# Patient Record
Sex: Female | Born: 1975 | Race: White | Hispanic: No | Marital: Married | Smoking: Never smoker
Health system: Southern US, Community
[De-identification: ages and names within clinical notes are randomized; demographics above are authoritative.]

## PROBLEM LIST (undated history)

## (undated) DIAGNOSIS — K219 Gastro-esophageal reflux disease without esophagitis: Secondary | ICD-10-CM

## (undated) HISTORY — DX: Gastro-esophageal reflux disease without esophagitis: K21.9

---

## 1999-06-14 ENCOUNTER — Other Ambulatory Visit: Admission: RE | Admit: 1999-06-14 | Discharge: 1999-06-14 | Payer: Self-pay | Admitting: Obstetrics and Gynecology

## 2000-06-12 ENCOUNTER — Other Ambulatory Visit: Admission: RE | Admit: 2000-06-12 | Discharge: 2000-06-12 | Payer: Self-pay | Admitting: Obstetrics and Gynecology

## 2001-10-09 ENCOUNTER — Other Ambulatory Visit: Admission: RE | Admit: 2001-10-09 | Discharge: 2001-10-09 | Payer: Self-pay | Admitting: *Deleted

## 2001-12-19 ENCOUNTER — Other Ambulatory Visit: Admission: RE | Admit: 2001-12-19 | Discharge: 2001-12-19 | Payer: Self-pay | Admitting: *Deleted

## 2002-10-09 ENCOUNTER — Other Ambulatory Visit: Admission: RE | Admit: 2002-10-09 | Discharge: 2002-10-09 | Payer: Self-pay | Admitting: *Deleted

## 2003-12-24 ENCOUNTER — Other Ambulatory Visit: Admission: RE | Admit: 2003-12-24 | Discharge: 2003-12-24 | Payer: Self-pay | Admitting: *Deleted

## 2004-05-04 ENCOUNTER — Encounter: Admission: RE | Admit: 2004-05-04 | Discharge: 2004-05-04 | Payer: Self-pay | Admitting: Internal Medicine

## 2005-01-24 ENCOUNTER — Other Ambulatory Visit: Admission: RE | Admit: 2005-01-24 | Discharge: 2005-01-24 | Payer: Self-pay | Admitting: *Deleted

## 2006-01-25 ENCOUNTER — Other Ambulatory Visit: Admission: RE | Admit: 2006-01-25 | Discharge: 2006-01-25 | Payer: Self-pay | Admitting: *Deleted

## 2007-01-29 ENCOUNTER — Other Ambulatory Visit: Admission: RE | Admit: 2007-01-29 | Discharge: 2007-01-29 | Payer: Self-pay | Admitting: *Deleted

## 2008-01-30 ENCOUNTER — Other Ambulatory Visit: Admission: RE | Admit: 2008-01-30 | Discharge: 2008-01-30 | Payer: Self-pay | Admitting: Gynecology

## 2009-03-24 ENCOUNTER — Ambulatory Visit (HOSPITAL_COMMUNITY): Admission: RE | Admit: 2009-03-24 | Discharge: 2009-03-24 | Payer: Self-pay | Admitting: Gynecology

## 2009-08-04 ENCOUNTER — Encounter: Admission: RE | Admit: 2009-08-04 | Discharge: 2009-11-02 | Payer: Self-pay | Admitting: Obstetrics and Gynecology

## 2009-08-07 ENCOUNTER — Ambulatory Visit (HOSPITAL_COMMUNITY): Admission: RE | Admit: 2009-08-07 | Discharge: 2009-08-07 | Payer: Self-pay | Admitting: Obstetrics and Gynecology

## 2009-09-04 ENCOUNTER — Ambulatory Visit (HOSPITAL_COMMUNITY): Admission: RE | Admit: 2009-09-04 | Discharge: 2009-09-04 | Payer: Self-pay | Admitting: Obstetrics and Gynecology

## 2009-09-22 ENCOUNTER — Inpatient Hospital Stay (HOSPITAL_COMMUNITY): Admission: AD | Admit: 2009-09-22 | Discharge: 2009-09-22 | Payer: Self-pay | Admitting: Obstetrics and Gynecology

## 2009-11-17 ENCOUNTER — Inpatient Hospital Stay (HOSPITAL_COMMUNITY): Admission: AD | Admit: 2009-11-17 | Discharge: 2009-11-17 | Payer: Self-pay | Admitting: Obstetrics and Gynecology

## 2009-11-21 ENCOUNTER — Encounter: Payer: Self-pay | Admitting: Emergency Medicine

## 2009-11-21 ENCOUNTER — Inpatient Hospital Stay (HOSPITAL_COMMUNITY): Admission: AD | Admit: 2009-11-21 | Discharge: 2009-11-21 | Payer: Self-pay | Admitting: Obstetrics and Gynecology

## 2009-12-04 ENCOUNTER — Inpatient Hospital Stay (HOSPITAL_COMMUNITY): Admission: AD | Admit: 2009-12-04 | Discharge: 2009-12-04 | Payer: Self-pay | Admitting: Obstetrics and Gynecology

## 2009-12-22 ENCOUNTER — Inpatient Hospital Stay (HOSPITAL_COMMUNITY): Admission: AD | Admit: 2009-12-22 | Discharge: 2009-12-25 | Payer: Self-pay | Admitting: Obstetrics & Gynecology

## 2010-08-08 ENCOUNTER — Encounter: Payer: Self-pay | Admitting: Obstetrics and Gynecology

## 2010-10-04 LAB — CBC
HCT: 27.8 % — ABNORMAL LOW (ref 36.0–46.0)
HCT: 33.5 % — ABNORMAL LOW (ref 36.0–46.0)
HCT: 32.5 % — ABNORMAL LOW (ref 36.0–46.0)
Hemoglobin: 11.6 g/dL — ABNORMAL LOW (ref 12.0–15.0)
Hemoglobin: 9.8 g/dL — ABNORMAL LOW (ref 12.0–15.0)
Hemoglobin: 11.3 g/dL — ABNORMAL LOW (ref 12.0–15.0)
MCHC: 34.5 g/dL (ref 30.0–36.0)
MCHC: 35.2 g/dL (ref 30.0–36.0)
MCHC: 34.8 g/dL (ref 30.0–36.0)
MCV: 89.8 fL (ref 78.0–100.0)
MCV: 90.4 fL (ref 78.0–100.0)
MCV: 89.5 fL (ref 78.0–100.0)
Platelets: 177 10*3/uL (ref 150–400)
Platelets: 214 10*3/uL (ref 150–400)
Platelets: 243 10*3/uL (ref 150–400)
RBC: 3.08 MIL/uL — ABNORMAL LOW (ref 3.87–5.11)
RBC: 3.73 MIL/uL — ABNORMAL LOW (ref 3.87–5.11)
RBC: 3.64 MIL/uL — ABNORMAL LOW (ref 3.87–5.11)
RDW: 15.1 % (ref 11.5–15.5)
RDW: 15.3 % (ref 11.5–15.5)
RDW: 15 % (ref 11.5–15.5)
WBC: 11.8 10*3/uL — ABNORMAL HIGH (ref 4.0–10.5)
WBC: 12.5 10*3/uL — ABNORMAL HIGH (ref 4.0–10.5)
WBC: 11.8 10*3/uL — ABNORMAL HIGH (ref 4.0–10.5)

## 2010-10-04 LAB — RH IMMUNE GLOB WKUP(>/=20WKS)(NOT WOMEN'S HOSP): Fetal Screen: NEGATIVE

## 2010-10-04 LAB — COMPREHENSIVE METABOLIC PANEL
ALT: 18 U/L (ref 0–35)
AST: 18 U/L (ref 0–37)
Albumin: 2.7 g/dL — ABNORMAL LOW (ref 3.5–5.2)
Alkaline Phosphatase: 110 U/L (ref 39–117)
BUN: 7 mg/dL (ref 6–23)
CO2: 21 mEq/L (ref 19–32)
Calcium: 8.9 mg/dL (ref 8.4–10.5)
Chloride: 107 mEq/L (ref 96–112)
Creatinine, Ser: 0.63 mg/dL (ref 0.4–1.2)
GFR calc Af Amer: >60 mL/min (ref >60)
GFR calc non Af Amer: >60 mL/min (ref >60)
Glucose, Bld: 74 mg/dL (ref 70–99)
Potassium: 4.1 mEq/L (ref 3.5–5.1)
Sodium: 135 mEq/L (ref 135–145)
Total Bilirubin: 0.2 mg/dL — ABNORMAL LOW (ref 0.3–1.2)
Total Protein: 6 g/dL (ref 6.0–8.3)

## 2010-10-04 LAB — GLUCOSE, CAPILLARY
Glucose-Capillary: 111 mg/dL — ABNORMAL HIGH (ref 70–99)
Glucose-Capillary: 90 mg/dL (ref 70–99)
Glucose-Capillary: 97 mg/dL (ref 70–99)

## 2010-10-04 LAB — URIC ACID: Uric Acid, Serum: 3.1 mg/dL (ref 2.4–7.0)

## 2010-10-04 LAB — LACTATE DEHYDROGENASE: LDH: 111 U/L (ref 94–250)

## 2010-10-04 LAB — RPR: RPR Ser Ql: NONREACTIVE

## 2010-10-05 LAB — KLEIHAUER-BETKE STAIN
Fetal Cells %: 0 %
Quantitation Fetal Hemoglobin: 0 mL

## 2010-10-05 LAB — GLUCOSE, CAPILLARY: Glucose-Capillary: 78 mg/dL (ref 70–99)

## 2010-10-11 LAB — CBC
HCT: 33 % — ABNORMAL LOW (ref 36.0–46.0)
Hemoglobin: 11 g/dL — ABNORMAL LOW (ref 12.0–15.0)
MCHC: 33.4 g/dL (ref 30.0–36.0)
MCV: 90.3 fL (ref 78.0–100.0)
Platelets: 253 10*3/uL (ref 150–400)
RBC: 3.66 MIL/uL — ABNORMAL LOW (ref 3.87–5.11)
RDW: 14.4 % (ref 11.5–15.5)
WBC: 12.9 10*3/uL — ABNORMAL HIGH (ref 4.0–10.5)

## 2010-10-11 LAB — KLEIHAUER-BETKE STAIN
Fetal Cells %: 0 %
Quantitation Fetal Hemoglobin: 0 mL

## 2016-03-25 ENCOUNTER — Other Ambulatory Visit: Payer: Self-pay | Admitting: Surgical Oncology

## 2016-03-25 DIAGNOSIS — K449 Diaphragmatic hernia without obstruction or gangrene: Secondary | ICD-10-CM

## 2016-04-04 ENCOUNTER — Other Ambulatory Visit: Payer: Self-pay

## 2016-04-05 ENCOUNTER — Ambulatory Visit
Admission: RE | Admit: 2016-04-05 | Discharge: 2016-04-05 | Disposition: A | Discharge status: Home / Self Care | Payer: 59 | Source: Ambulatory Visit | Attending: Surgical Oncology | Admitting: Surgical Oncology

## 2016-04-05 DIAGNOSIS — K449 Diaphragmatic hernia without obstruction or gangrene: Secondary | ICD-10-CM

## 2016-07-21 DIAGNOSIS — Z9884 Bariatric surgery status: Secondary | ICD-10-CM | POA: Diagnosis not present

## 2016-07-28 DIAGNOSIS — Z01419 Encounter for gynecological examination (general) (routine) without abnormal findings: Secondary | ICD-10-CM | POA: Diagnosis not present

## 2016-08-22 DIAGNOSIS — D6489 Other specified anemias: Secondary | ICD-10-CM | POA: Diagnosis not present

## 2016-08-22 DIAGNOSIS — Z9884 Bariatric surgery status: Secondary | ICD-10-CM | POA: Diagnosis not present

## 2016-09-06 DIAGNOSIS — Z01818 Encounter for other preprocedural examination: Secondary | ICD-10-CM | POA: Diagnosis not present

## 2016-09-06 DIAGNOSIS — Z112 Encounter for screening for other bacterial diseases: Secondary | ICD-10-CM | POA: Diagnosis not present

## 2016-09-22 DIAGNOSIS — K219 Gastro-esophageal reflux disease without esophagitis: Secondary | ICD-10-CM | POA: Diagnosis not present

## 2016-09-27 DIAGNOSIS — K219 Gastro-esophageal reflux disease without esophagitis: Secondary | ICD-10-CM | POA: Diagnosis not present

## 2016-10-26 DIAGNOSIS — Z713 Dietary counseling and surveillance: Secondary | ICD-10-CM | POA: Diagnosis not present

## 2017-01-23 DIAGNOSIS — Z713 Dietary counseling and surveillance: Secondary | ICD-10-CM | POA: Diagnosis not present

## 2017-01-23 DIAGNOSIS — E569 Vitamin deficiency, unspecified: Secondary | ICD-10-CM | POA: Diagnosis not present

## 2017-03-29 DIAGNOSIS — Z903 Acquired absence of stomach [part of]: Secondary | ICD-10-CM | POA: Diagnosis not present

## 2017-08-04 DIAGNOSIS — Z01419 Encounter for gynecological examination (general) (routine) without abnormal findings: Secondary | ICD-10-CM | POA: Diagnosis not present

## 2017-09-15 DIAGNOSIS — R7301 Impaired fasting glucose: Secondary | ICD-10-CM | POA: Diagnosis not present

## 2017-09-15 DIAGNOSIS — E538 Deficiency of other specified B group vitamins: Secondary | ICD-10-CM | POA: Diagnosis not present

## 2017-09-15 DIAGNOSIS — R82998 Other abnormal findings in urine: Secondary | ICD-10-CM | POA: Diagnosis not present

## 2017-09-15 DIAGNOSIS — Z0000 Encounter for general adult medical examination without abnormal findings: Secondary | ICD-10-CM | POA: Diagnosis not present

## 2017-09-22 DIAGNOSIS — E538 Deficiency of other specified B group vitamins: Secondary | ICD-10-CM | POA: Diagnosis not present

## 2017-09-22 DIAGNOSIS — Z0000 Encounter for general adult medical examination without abnormal findings: Secondary | ICD-10-CM | POA: Diagnosis not present

## 2017-09-22 DIAGNOSIS — Z1389 Encounter for screening for other disorder: Secondary | ICD-10-CM | POA: Diagnosis not present

## 2017-09-22 DIAGNOSIS — J0100 Acute maxillary sinusitis, unspecified: Secondary | ICD-10-CM | POA: Diagnosis not present

## 2017-09-22 DIAGNOSIS — R3121 Asymptomatic microscopic hematuria: Secondary | ICD-10-CM | POA: Diagnosis not present

## 2017-09-27 DIAGNOSIS — Z9884 Bariatric surgery status: Secondary | ICD-10-CM | POA: Diagnosis not present

## 2017-11-28 DIAGNOSIS — L920 Granuloma annulare: Secondary | ICD-10-CM | POA: Diagnosis not present

## 2017-12-31 IMAGING — RF DG UGI W/ HIGH DENSITY W/KUB
18 of 24 series · 18 of 24 positions shown · non-contrast
Comparison: None.

CLINICAL DATA: Preop for bariatric surgery

EXAM:
UPPER GI SERIES WITH KUB
TECHNIQUE: After obtaining a scout radiograph a routine upper GI series was
performed using thin and high density barium.
FLUOROSCOPY TIME:  Fluoroscopy Time:  1 minutes 54 seconds
Radiation Exposure Index (if provided by the fluoroscopic device):
164 deciGy per square cm
Number of Acquired Spot Images: 0

[Series 1: run · 1 of 1 slices shown (1 of 17)]
[im 1/1]
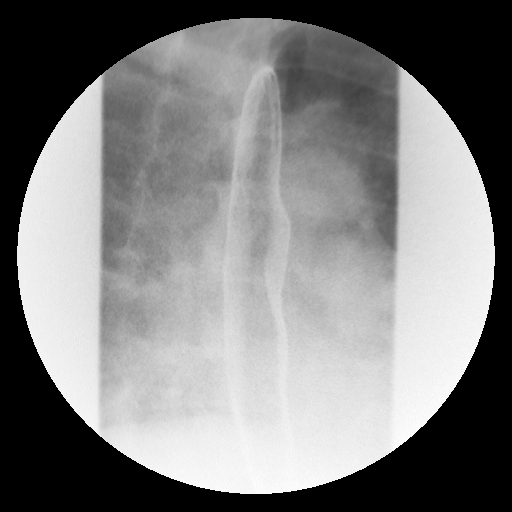

[Series 3: run · 1 of 1 slices shown (2 of 17)]
[im 1/1]
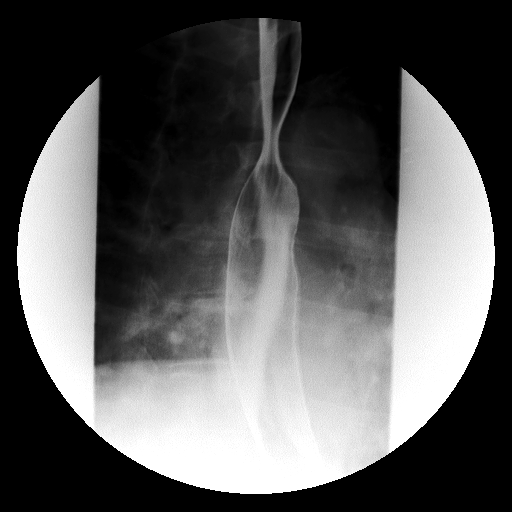

[Series 6: run · 1 of 1 slices shown (3 of 17)]
[im 1/1]
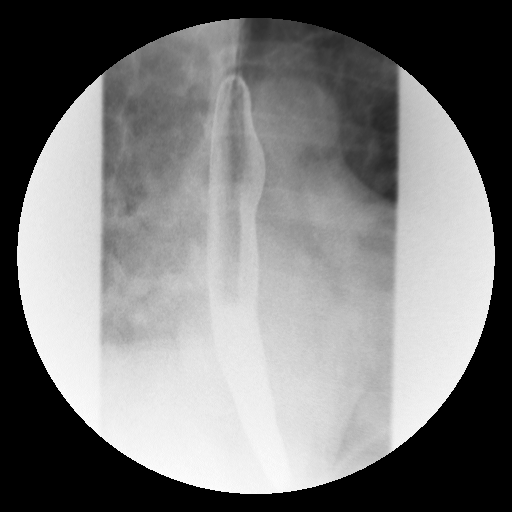

[Series 7: run · 1 of 1 slices shown (4 of 17)]
[im 1/1]
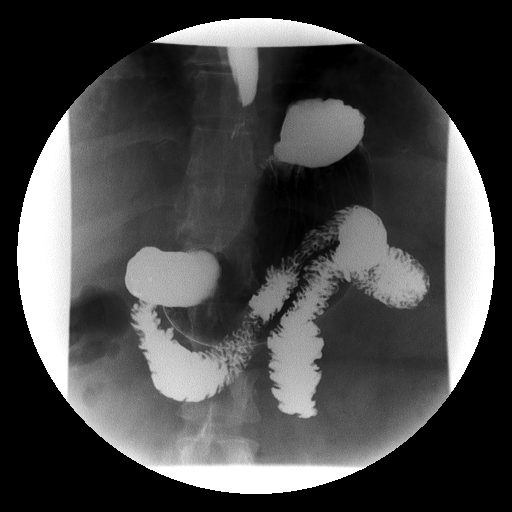

[Series 9: run · 1 of 1 slices shown (5 of 17)]
[im 1/1]
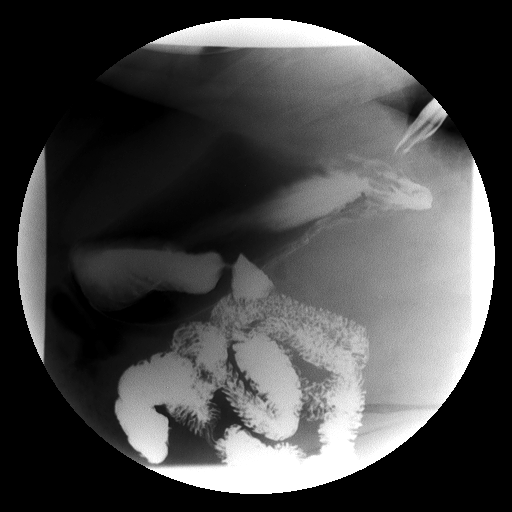

[Series 10: run · 1 of 1 slices shown (6 of 17)]
[im 1/1]
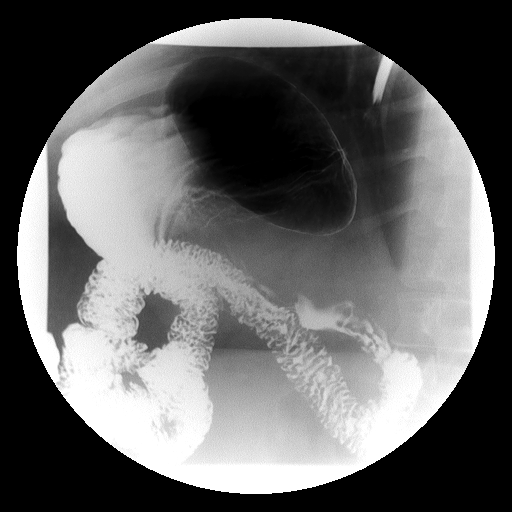

[Series 11: run · 1 of 1 slices shown (7 of 17)]
[im 1/1]
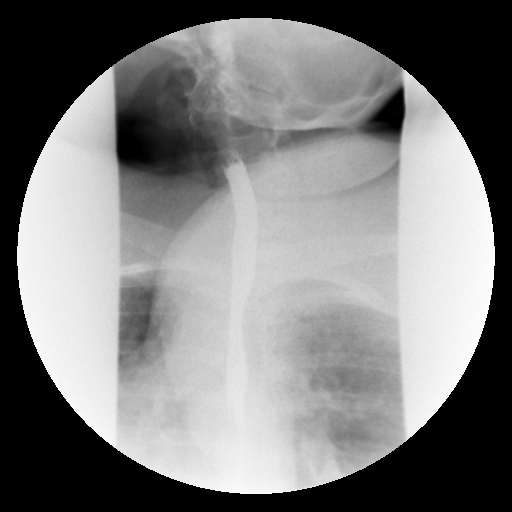

[Series 13: run · 1 of 1 slices shown (8 of 17)]
[im 1/1]
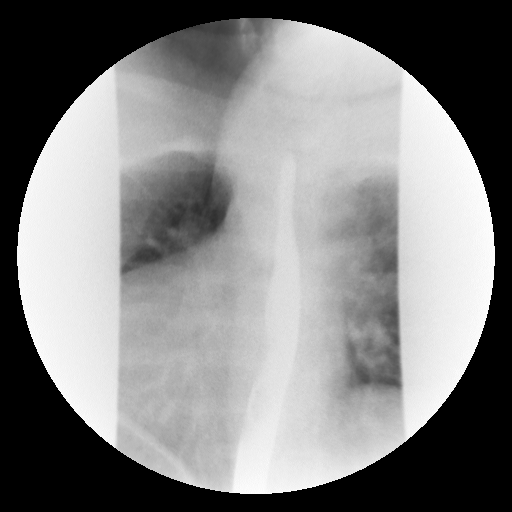

[Series 14: run · 1 of 1 slices shown (9 of 17)]
[im 1/1]
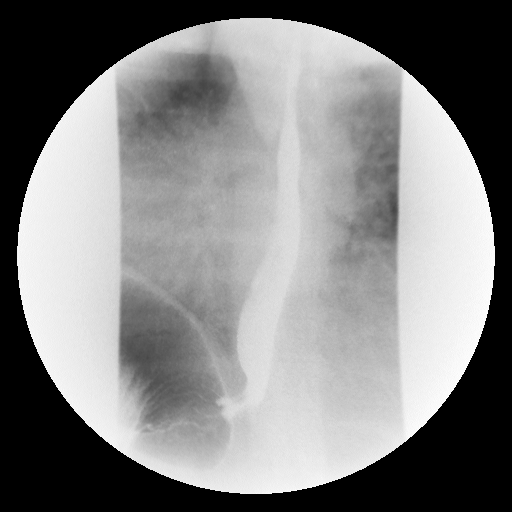

[Series 15: run · 1 of 1 slices shown (10 of 17)]
[im 1/1]
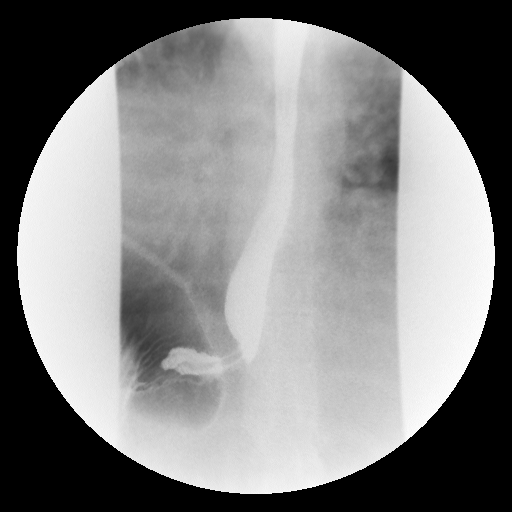

[Series 17: run · 1 of 1 slices shown (11 of 17)]
[im 1/1]
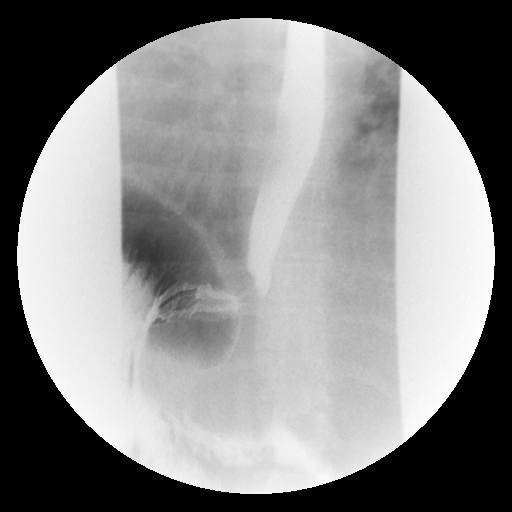

[Series 18: run · 1 of 1 slices shown (12 of 17)]
[im 1/1]
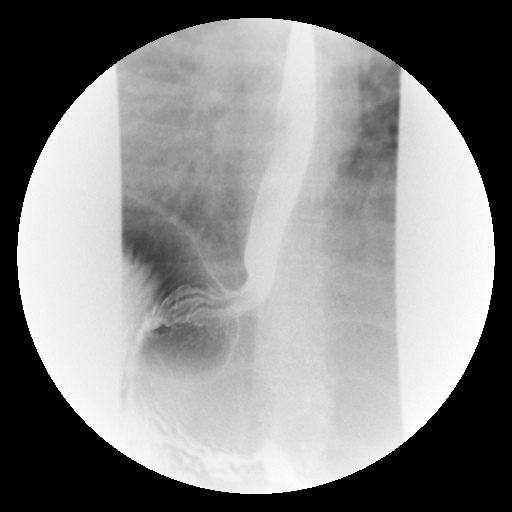

[Series 19: run · 1 of 1 slices shown (13 of 17)]
[im 1/1]
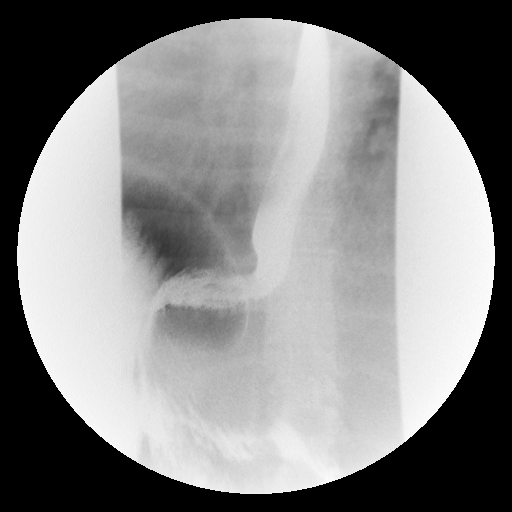

[Series 21: run · 1 of 1 slices shown (14 of 17)]
[im 1/1]
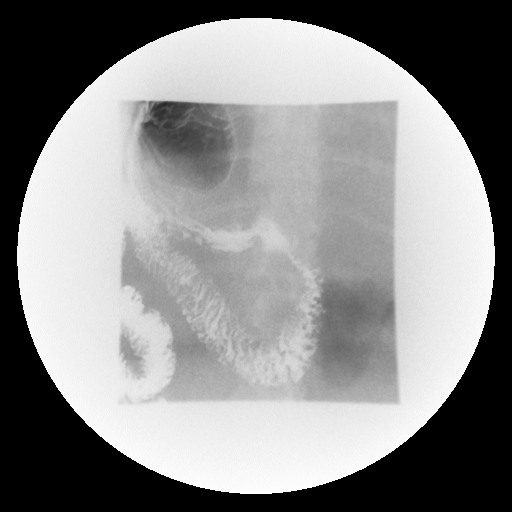

[Series 22: run · 1 of 1 slices shown (15 of 17)]
[im 1/1]
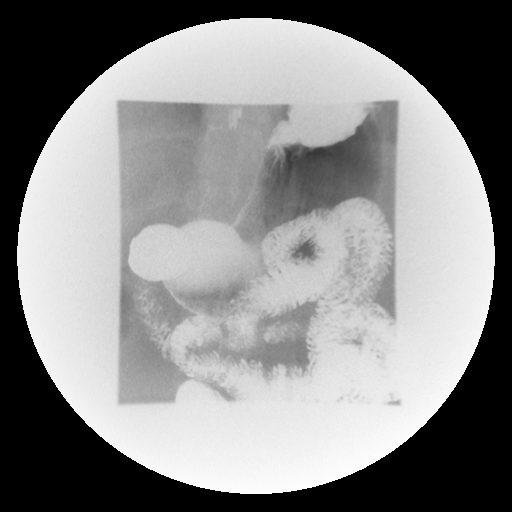

[Series 23: run · 1 of 1 slices shown (16 of 17)]
[im 1/1]
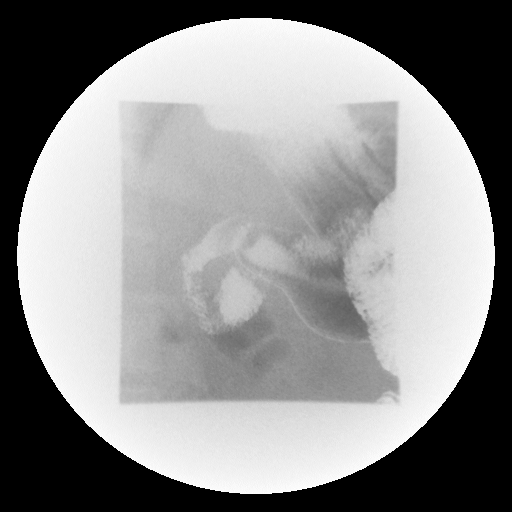

[Series 25: run · 1 of 1 slices shown (17 of 17)]
[im 1/1]
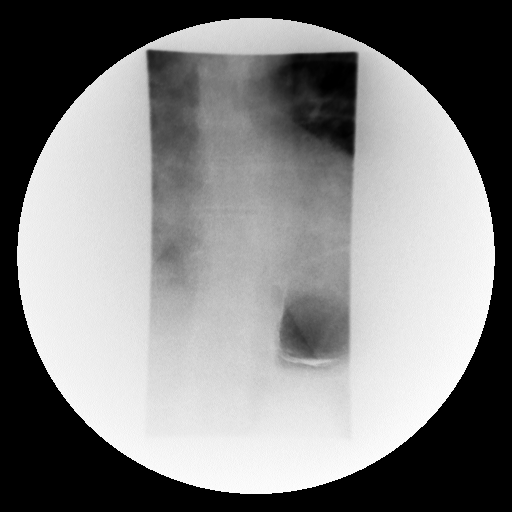

[Series 1001: view not recorded · 0.20mm/px · 1 of 1 slices shown]
[im 1/1]
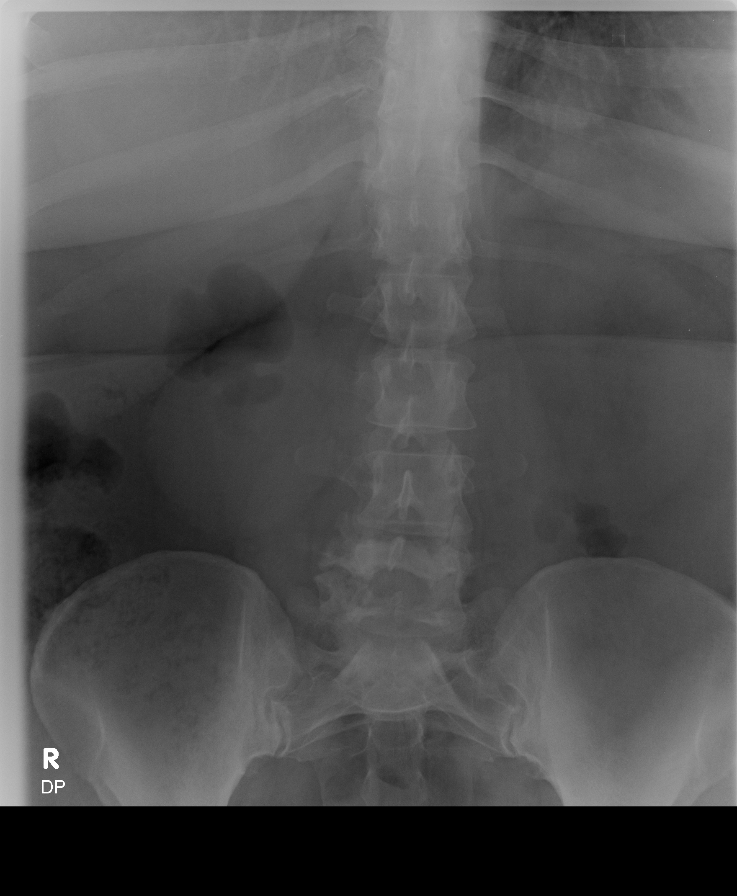

[18 of 24 positions shown; findings below may reference images not displayed]

FINDINGS: A preliminary film of the abdomen shows a nonspecific bowel gas
pattern. No opaque calculi are seen. The bones are unremarkable.

A double-contrast upper GI was performed. The mucosa of the
esophagus is unremarkable. A single contrast study shows the
swallowing mechanism to be normal. No hiatal hernia is seen. No
reflux is demonstrated. A barium pill was given at the end of the
study which passed into the stomach without delay.

The stomach is normal in contour and peristalsis. The duodenal bulb
fills and the duodenal loop is in normal position.
IMPRESSION: Negative double-contrast upper GI.

## 2018-01-24 DIAGNOSIS — E538 Deficiency of other specified B group vitamins: Secondary | ICD-10-CM | POA: Diagnosis not present

## 2018-04-07 DIAGNOSIS — Z23 Encounter for immunization: Secondary | ICD-10-CM | POA: Diagnosis not present

## 2018-05-21 DIAGNOSIS — Z6841 Body Mass Index (BMI) 40.0 and over, adult: Secondary | ICD-10-CM | POA: Diagnosis not present

## 2018-05-21 DIAGNOSIS — J029 Acute pharyngitis, unspecified: Secondary | ICD-10-CM | POA: Diagnosis not present

## 2018-05-31 DIAGNOSIS — E538 Deficiency of other specified B group vitamins: Secondary | ICD-10-CM | POA: Diagnosis not present

## 2018-06-20 DIAGNOSIS — J029 Acute pharyngitis, unspecified: Secondary | ICD-10-CM | POA: Diagnosis not present

## 2018-06-20 DIAGNOSIS — J309 Allergic rhinitis, unspecified: Secondary | ICD-10-CM | POA: Diagnosis not present

## 2018-06-20 DIAGNOSIS — J020 Streptococcal pharyngitis: Secondary | ICD-10-CM | POA: Diagnosis not present

## 2018-08-22 DIAGNOSIS — Z1231 Encounter for screening mammogram for malignant neoplasm of breast: Secondary | ICD-10-CM | POA: Diagnosis not present

## 2018-10-15 DIAGNOSIS — Z0000 Encounter for general adult medical examination without abnormal findings: Secondary | ICD-10-CM | POA: Diagnosis not present

## 2018-10-15 DIAGNOSIS — D509 Iron deficiency anemia, unspecified: Secondary | ICD-10-CM | POA: Diagnosis not present

## 2018-10-15 DIAGNOSIS — R7301 Impaired fasting glucose: Secondary | ICD-10-CM | POA: Diagnosis not present

## 2018-10-17 DIAGNOSIS — Z8371 Family history of colonic polyps: Secondary | ICD-10-CM | POA: Diagnosis not present

## 2018-10-17 DIAGNOSIS — D509 Iron deficiency anemia, unspecified: Secondary | ICD-10-CM | POA: Diagnosis not present

## 2018-10-17 DIAGNOSIS — Z0000 Encounter for general adult medical examination without abnormal findings: Secondary | ICD-10-CM | POA: Diagnosis not present

## 2018-10-17 DIAGNOSIS — J309 Allergic rhinitis, unspecified: Secondary | ICD-10-CM | POA: Diagnosis not present

## 2019-06-27 ENCOUNTER — Other Ambulatory Visit: Payer: Self-pay

## 2019-06-27 DIAGNOSIS — Z20822 Contact with and (suspected) exposure to covid-19: Secondary | ICD-10-CM

## 2019-06-29 LAB — NOVEL CORONAVIRUS, NAA: SARS-CoV-2, NAA: NOT DETECTED

## 2020-10-21 ENCOUNTER — Encounter: Payer: Self-pay | Admitting: Gastroenterology

## 2021-01-22 ENCOUNTER — Encounter: Payer: 59 | Admitting: Gastroenterology

## 2021-01-29 ENCOUNTER — Other Ambulatory Visit: Payer: Self-pay

## 2021-01-29 ENCOUNTER — Ambulatory Visit (AMBULATORY_SURGERY_CENTER): Payer: 59 | Admitting: *Deleted

## 2021-01-29 VITALS — Ht 62.5 in | Wt 275.0 lb

## 2021-01-29 DIAGNOSIS — Z1211 Encounter for screening for malignant neoplasm of colon: Secondary | ICD-10-CM

## 2021-01-29 MED ORDER — SUTAB 1479-225-188 MG PO TABS: 1.0000 | ORAL_TABLET | Freq: Once | ORAL | 0 refills | Status: AC

## 2021-01-29 NOTE — Progress Notes (Signed)
Virtual pre visit completed. Instructions sent through MyChart and email.  No egg or soy allergy known to patient  No issues with past sedation with any surgeries or procedures Patient denies ever being told they had issues or difficulty with intubation  No FH of Malignant Hyperthermia No diet pills per patient No home 02 use per patient  No blood thinners per patient  Pt denies issues with constipation  No A fib or A flutter  EMMI video to pt or via MyChart  COVID 19 guidelines implemented in PV today with Pt and RN     Coupon given to pt in PV today , Code to Pharmacy and  NO PA's for preps discussed with pt In PV today  Discussed with pt there will be an out-of-pocket cost for prep and that varies from $0 to 70 dollars   Due to the COVID-19 pandemic we are asking patients to follow certain guidelines.  Pt aware of COVID protocols and [facility] guidelines

## 2021-02-12 ENCOUNTER — Encounter: Payer: Self-pay | Admitting: Internal Medicine

## 2021-02-12 ENCOUNTER — Other Ambulatory Visit: Payer: Self-pay

## 2021-02-12 ENCOUNTER — Ambulatory Visit (AMBULATORY_SURGERY_CENTER): Payer: 59 | Admitting: Internal Medicine

## 2021-02-12 VITALS — BP 106/73 | HR 58 | Temp 97.3°F | Resp 20 | Ht 62.5 in | Wt 275.0 lb

## 2021-02-12 DIAGNOSIS — Z1211 Encounter for screening for malignant neoplasm of colon: Secondary | ICD-10-CM

## 2021-02-12 HISTORY — PX: COLONOSCOPY: SHX174

## 2021-02-12 MED ORDER — SODIUM CHLORIDE 0.9 % IV SOLN: 500.0000 mL | Freq: Once | INTRAVENOUS | Status: DC

## 2021-02-12 NOTE — Patient Instructions (Signed)
YOU HAD AN ENDOSCOPIC PROCEDURE TODAY AT THE [facility]:   Refer to the procedure report that was given to you for any specific questions about what was found during the examination.  If the procedure report does not answer your questions, please call your gastroenterologist to clarify.  If you requested that your care partner not be given the details of your procedure findings, then the procedure report has been included in a sealed envelope for you to review at your convenience later.  YOU SHOULD EXPECT: Some feelings of bloating in the abdomen. Passage of more gas than usual.  Walking can help get rid of the air that was put into your GI tract during the procedure and reduce the bloating. If you had a lower endoscopy (such as a colonoscopy or flexible sigmoidoscopy) you may notice spotting of blood in your stool or on the toilet paper. If you underwent a bowel prep for your procedure, you may not have a normal bowel movement for a few days.  Please Note:  You might notice some irritation and congestion in your nose or some drainage.  This is from the oxygen used during your procedure.  There is no need for concern and it should clear up in a day or so.  SYMPTOMS TO REPORT IMMEDIATELY:  Following lower endoscopy (colonoscopy or flexible sigmoidoscopy):  Excessive amounts of blood in the stool  Significant tenderness or worsening of abdominal pains  Swelling of the abdomen that is new, acute  Fever of 100F or higher   For urgent or emergent issues, a gastroenterologist can be reached at any hour by calling [phone]. Do not use MyChart messaging for urgent concerns.    DIET:  We do recommend a small meal at first, but then you may proceed to your regular diet.  Drink plenty of fluids but you should avoid alcoholic beverages for 24 hours.  Try to increase the fiber in your diet, and drink plenty of water.  ACTIVITY:  You should plan to take it easy for the rest of today and  you should NOT DRIVE or use heavy machinery until tomorrow (because of the sedation medicines used during the test).    FOLLOW UP: Our staff will call the number listed on your records 48-72 hours following your procedure to check on you and address any questions or concerns that you may have regarding the information given to you following your procedure. If we do not reach you, we will leave a message.  We will attempt to reach you two times.  During this call, we will ask if you have developed any symptoms of COVID 19. If you develop any symptoms (ie: fever, flu-like symptoms, shortness of breath, cough etc.) before then, please call [phone].  If you test positive for Covid 19 in the 2 weeks post procedure, please call and report this information to [place].     SIGNATURES/CONFIDENTIALITY: You and/or your care partner have signed paperwork which will be entered into your electronic medical record.  These signatures attest to the fact that that the information above on your After Visit Summary has been reviewed and is understood.  Full responsibility of the confidentiality of this discharge information lies with you and/or your care-partner.

## 2021-02-12 NOTE — Progress Notes (Signed)
Report to PACU, RN, vss, BBS= Clear.  

## 2021-02-12 NOTE — Progress Notes (Signed)
Pt's states no medical or surgical changes since previsit or office visit.  VS CW  

## 2021-02-12 NOTE — Op Note (Signed)
[facility] Patient Name: Jill Murphy Procedure Date: 02/12/2021 9:49 AM MRN: [id number] Endoscopist: Wilhemina Bonito. Marina Goodell , MD Age: 45 Referring MD:  Date of Birth: 08-Dec-1975 Gender: Female Account #: [id number] Procedure:                Colonoscopy Indications:              Screening for colorectal malignant neoplasm Medicines:                Monitored Anesthesia Care Procedure:                Pre-Anesthesia Assessment:                           - Prior to the procedure, a History and Physical                            was performed, and patient medications and                            allergies were reviewed. The patient's tolerance of                            previous anesthesia was also reviewed. The risks                            and benefits of the procedure and the sedation                            options and risks were discussed with the patient.                            All questions were answered, and informed consent                            was obtained. Prior Anticoagulants: The patient has                            taken no previous anticoagulant or antiplatelet                            agents. ASA Grade Assessment: II - A patient with                            mild systemic disease. After reviewing the risks                            and benefits, the patient was deemed in                            satisfactory condition to undergo the procedure.                           After obtaining informed consent, the colonoscope  was passed under direct vision. Throughout the                            procedure, the patient's blood pressure, pulse, and                            oxygen saturations were monitored continuously. The                            Olympus CF-HQ190L (Serial# [id number]) Colonoscope was                            introduced through the anus and advanced to the the                            cecum,  identified by appendiceal orifice and                            ileocecal valve. The ileocecal valve, appendiceal                            orifice, and rectum were photographed. The quality                            of the bowel preparation was excellent. The                            colonoscopy was performed without difficulty. The                            patient tolerated the procedure well. The bowel                            preparation used was SUPREP via split dose                            instruction. Scope In: 9:54:19 AM Scope Out: 10:04:29 AM Scope Withdrawal Time: 0 hours 8 minutes 13 seconds  Total Procedure Duration: 0 hours 10 minutes 10 seconds  Findings:                 Diverticula were found in the sigmoid colon.                           The exam was otherwise without abnormality on                            direct and retroflexion views. Complications:            No immediate complications. Estimated blood loss:                            None. Estimated Blood Loss:     Estimated blood loss: none. Impression:               - Diverticulosis in the sigmoid  colon.                           - The examination was otherwise normal on direct                            and retroflexion views.                           - No specimens collected. Recommendation:           - Repeat colonoscopy in 10 years for screening                            purposes.                           - Patient has a contact number available for                            emergencies. The signs and symptoms of potential                            delayed complications were discussed with the                            patient. Return to normal activities tomorrow.                            Written discharge instructions were provided to the                            patient.                           - Resume previous diet.                           - Continue present  medications. Wilhemina Bonito. Marina Goodell, MD 02/12/2021 10:12:34 AM This report has been signed electronically.

## 2021-02-16 ENCOUNTER — Telehealth: Payer: Self-pay

## 2021-02-16 NOTE — Telephone Encounter (Signed)
  Follow up Call-  Call back number 02/12/2021  Post procedure Call Back phone  # [phone]  Permission to leave phone message Yes  Some recent data might be hidden     Patient questions:  Do you have a fever, pain , or abdominal swelling? No. Pain Score  0 *  Have you tolerated food without any problems? Yes.    Have you been able to return to your normal activities? Yes.    Do you have any questions about your discharge instructions: Diet   No. Medications  No. Follow up visit  No.  Do you have questions or concerns about your Care? No.  Actions: * If pain score is 4 or above: No action needed, pain <4.  Have you developed a fever since your procedure? no  2.   Have you had an respiratory symptoms (SOB or cough) since your procedure? no  3.   Have you tested positive for COVID 19 since your procedure no  4.   Have you had any family members/close contacts diagnosed with the COVID 19 since your procedure?  no   If yes to any of these questions please route to Laverna Peace, RN and Karlton Lemon, RN

## 2022-11-03 ENCOUNTER — Other Ambulatory Visit: Payer: Self-pay | Admitting: Obstetrics and Gynecology

## 2022-11-03 DIAGNOSIS — R928 Other abnormal and inconclusive findings on diagnostic imaging of breast: Secondary | ICD-10-CM

## 2022-11-12 ENCOUNTER — Ambulatory Visit
Admission: RE | Admit: 2022-11-12 | Discharge: 2022-11-12 | Disposition: A | Discharge status: Home / Self Care | Payer: 59 | Source: Ambulatory Visit | Attending: Obstetrics and Gynecology | Admitting: Obstetrics and Gynecology

## 2022-11-12 DIAGNOSIS — R928 Other abnormal and inconclusive findings on diagnostic imaging of breast: Secondary | ICD-10-CM

## 2023-10-21 ENCOUNTER — Ambulatory Visit: Admission: EM | Admit: 2023-10-21 | Discharge: 2023-10-21 | Disposition: A | Discharge status: Home / Self Care

## 2023-10-21 DIAGNOSIS — J101 Influenza due to other identified influenza virus with other respiratory manifestations: Secondary | ICD-10-CM

## 2023-10-21 DIAGNOSIS — T783XXA Angioneurotic edema, initial encounter: Secondary | ICD-10-CM | POA: Diagnosis not present

## 2023-10-21 LAB — POC COVID19/FLU A&B COMBO
Covid Antigen, POC: NEGATIVE
Influenza A Antigen, POC: POSITIVE — AB
Influenza B Antigen, POC: NEGATIVE

## 2023-10-21 MED ORDER — PREDNISONE 10 MG (21) PO TBPK: ORAL_TABLET | Freq: Every day | ORAL | 0 refills | Status: AC

## 2023-10-21 MED ORDER — DEXAMETHASONE SODIUM PHOSPHATE 10 MG/ML IJ SOLN
10.0000 mg | Freq: Once | INTRAMUSCULAR | Status: AC
  Administered 2023-10-21: 10 mg via INTRAMUSCULAR

## 2023-10-21 MED ORDER — OSELTAMIVIR PHOSPHATE 75 MG PO CAPS: 75.0000 mg | ORAL_CAPSULE | Freq: Two times a day (BID) | ORAL | 0 refills | Status: AC

## 2023-10-21 NOTE — ED Triage Notes (Signed)
 Patient to Urgent Care with complaints of left sided facial swelling and redness (feels tight and hot)/ temp 103 this morning/ body aches.   Symptoms started yesterday. Woke up with facial swelling. States her daughter has the flu/ husband has also been sick.  Prescribed Zithromax yesterday (from pcp). Taking Mucinex-DM/ tylenol-PM.

## 2023-10-21 NOTE — ED Provider Notes (Signed)
 Jill Murphy    CSN: [id number] Arrival date & time: 10/21/23  4098      History   Chief Complaint Chief Complaint  Patient presents with   Facial Swelling    HPI Jill Murphy is a 48 y.o. female.  Patient presents with left side facial swelling, redness, warmth since she woke up this morning.  She denies difficulty swallowing or breathing.  She started Zithromax yesterday (one dose yesterday and none today) which was prescribed by her PCP for nasal congestion and cough.  She took Mucinex DM and Tylenol PM yesterday also.  She reports the nasal congestion and cough started yesterday.  She has a fever and bodyaches today.  Tmax 103.  She took plain Tylenol this morning for her fever.  She denies sore throat, difficulty swallowing, change in voice, shortness of breath.  No previous history of medication reaction.  Patient's daughter has influenza.   The history is provided by the patient, the spouse and medical records.    Past Medical History:  Diagnosis Date   GERD (gastroesophageal reflux disease)     There are no active problems to display for this patient.   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      Home Medications    Prior to Admission medications   Medication Sig Start Date End Date Taking? Authorizing Provider  oseltamivir (TAMIFLU) 75 MG capsule Take 1 capsule (75 mg total) by mouth every 12 (twelve) hours. 10/21/23  Yes Mickie Bail, NP  predniSONE (STERAPRED UNI-PAK 21 TAB) 10 MG (21) TBPK tablet Take by mouth daily. As directed 10/22/23  Yes Mickie Bail, NP  cyanocobalamin 1000 MCG tablet Take 1,000 mcg by mouth daily. 07/18/05   [provider]  Ferrous Sulfate Dried (FERROUS SULFATE CR PO) Take 65 mg by mouth daily.    [provider]  norgestimate-ethinyl  estradiol (ORTHO-CYCLEN) 0.25-35 MG-MCG tablet Take 1 tablet by mouth daily. 01/04/21   [provider]  omeprazole (PRILOSEC) 20 MG capsule Take 20 mg by mouth daily.    [provider]  sertraline (ZOLOFT) 100 MG tablet Take 100 mg by mouth daily. 01/19/21   [provider]    Family History Family History  Problem Relation Age of Onset   Colon polyps Father    Colon cancer Neg Hx    Esophageal cancer Neg Hx    Rectal cancer Neg Hx    Stomach cancer Neg Hx     Social History Social History   Tobacco Use   Smoking status: Never    Passive exposure: Never   Smokeless tobacco: Never  Vaping Use   Vaping status: Never Used  Substance Use Topics   Alcohol use: Never   Drug use: Never     Allergies   Zithromax [azithromycin]   Review of Systems Review of Systems  Constitutional:  Positive for fever. Negative for chills.  HENT:  Positive for congestion and facial swelling. Negative for ear pain, sore throat, trouble swallowing and voice change.   Respiratory:  Positive for cough. Negative for shortness of breath.      Physical Exam Triage Vital Signs ED Triage Vitals  Encounter Vitals Group     BP      Systolic BP Percentile      Diastolic BP Percentile  Pulse      Resp      Temp      Temp src      SpO2      Weight      Height      Head Circumference      Peak Flow      Pain Score      Pain Loc      Pain Education      Exclude from Growth Chart    No data found.  Updated Vital Signs BP 105/68   Pulse 84   Temp 98.8 F (37.1 C)   Resp 18   LMP 09/30/2023   SpO2 98%   Visual Acuity Right Eye Distance:   Left Eye Distance:   Bilateral Distance:    Right Eye Near:   Left Eye Near:    Bilateral Near:     Physical Exam Constitutional:      General: She is not in acute distress. HENT:     Head:     Comments: Left facial cheek moderately edematous, erythematous, slightly warm.  No wounds or drainage.    Right Ear:  Tympanic membrane normal.     Left Ear: Tympanic membrane normal.     Nose: Nose normal.     Mouth/Throat:     Mouth: Mucous membranes are moist.     Pharynx: Oropharynx is clear.     Comments: No oropharyngeal swelling.  Voice clear.  No difficulty swallowing. Cardiovascular:     Rate and Rhythm: Normal rate and regular rhythm.     Heart sounds: Normal heart sounds.  Pulmonary:     Effort: Pulmonary effort is normal. No respiratory distress.     Breath sounds: Normal breath sounds.     Comments: Good air movement. Neurological:     Mental Status: She is alert.      UC Treatments / Results  Labs (all labs ordered are listed, but only abnormal results are displayed) Labs Reviewed  POC COVID19/FLU A&B COMBO - Abnormal; Notable for the following components:      Result Value   Influenza A Antigen, POC Positive (*)    All other components within normal limits    EKG   Radiology No results found.  Procedures Procedures (including critical care time)  Medications Ordered in UC Medications  dexamethasone (DECADRON) injection 10 mg (10 mg Intramuscular Given 10/21/23 0910)    Initial Impression / Assessment and Plan / UC Course  I have reviewed the triage vital signs and the nursing notes.  Pertinent labs & imaging results that were available during my care of the patient were reviewed by me and considered in my medical decision making (see chart for details).   Angioedema, influenza A.   Afebrile and vital signs are stable.  No difficulty swallowing or breathing.  Patient started a Z-Pak yesterday.  Her facial swelling was present when she woke up this morning.  She has taken Zithromax in the past without difficulty.  Patient's congestion and cough started yesterday.  Rapid flu positive for influenza A today.  COVID negative.   For angioedema, dexamethasone given here and starting prednisone taper tomorrow.  Instructed patient to start Zyrtec today and take daily for 14  days.  Strict 911 and ED precautions discussed.  Instructed patient to follow-up with her PCP on Monday.  Zithromax added to allergy list for patient.   For influenza A, Tamiflu prescribed.  Instructed patient not to start the Tamiflu until this evening  and only if her facial edema has improved or resolved.  Discussed that the Tamiflu may complicate the diagnosis of her angioedema in trying to pinpoint what is causing the facial swelling.  Discussed that her flu symptoms will improve without Tamiflu.   Discussed plain Tylenol as needed for fever or discomfort and no other over-the-counter medicines other than Zyrtec.  Again discussed with patient that other medications may complicate pinpointing the cause of her facial swelling.   She agrees to plan of care.  Final Clinical Impressions(s) / UC Diagnoses   Final diagnoses:  Angioedema, initial encounter  Influenza A     Discharge Instructions      Follow up with your primary care provider on Monday.   Call 911 and go to the emergency department if you have worsening symptoms.    You were given an injection of a steroid called dexamethasone.  Start the prednisone taper tomorrow as directed.  Take Zyrtec as directed.    For your influenza, take the Tamiflu as directed.  As discussed, only start this medication if your facial swelling has resolved.          ED Prescriptions     Medication Sig Dispense Auth. Provider   oseltamivir (TAMIFLU) 75 MG capsule Take 1 capsule (75 mg total) by mouth every 12 (twelve) hours. 10 capsule Mickie Bail, NP   predniSONE (STERAPRED UNI-PAK 21 TAB) 10 MG (21) TBPK tablet Take by mouth daily. As directed 21 tablet Mickie Bail, NP      PDMP not reviewed this encounter.   Mickie Bail, NP 10/21/23 [phone]

## 2023-10-21 NOTE — Discharge Instructions (Addendum)
 Follow up with your primary care provider on Monday.   Call 911 and go to the emergency department if you have worsening symptoms.    You were given an injection of a steroid called dexamethasone.  Start the prednisone taper tomorrow as directed.  Take Zyrtec as directed.    For your influenza, take the Tamiflu as directed.  As discussed, only start this medication if your facial swelling has resolved.
